# Patient Record
Sex: Female | Born: 1995 | Race: Black or African American | Hispanic: No | Marital: Single | Smoking: Never smoker
Health system: Southern US, Community
[De-identification: ages and names within clinical notes are randomized; demographics above are authoritative.]

---

## 2018-09-28 ENCOUNTER — Emergency Department (HOSPITAL_COMMUNITY)
Admission: EM | Admit: 2018-09-28 | Discharge: 2018-09-28 | Disposition: A | Discharge status: Home / Self Care | Payer: BLUE CROSS/BLUE SHIELD | Attending: Emergency Medicine | Admitting: Emergency Medicine

## 2018-09-28 ENCOUNTER — Encounter (HOSPITAL_COMMUNITY): Payer: Self-pay | Admitting: *Deleted

## 2018-09-28 ENCOUNTER — Other Ambulatory Visit: Payer: Self-pay

## 2018-09-28 ENCOUNTER — Emergency Department (HOSPITAL_COMMUNITY): Payer: BLUE CROSS/BLUE SHIELD

## 2018-09-28 DIAGNOSIS — S161XXA Strain of muscle, fascia and tendon at neck level, initial encounter: Secondary | ICD-10-CM | POA: Diagnosis not present

## 2018-09-28 DIAGNOSIS — Y939 Activity, unspecified: Secondary | ICD-10-CM | POA: Diagnosis not present

## 2018-09-28 DIAGNOSIS — M542 Cervicalgia: Secondary | ICD-10-CM | POA: Diagnosis present

## 2018-09-28 DIAGNOSIS — Y999 Unspecified external cause status: Secondary | ICD-10-CM | POA: Insufficient documentation

## 2018-09-28 DIAGNOSIS — Y929 Unspecified place or not applicable: Secondary | ICD-10-CM | POA: Diagnosis not present

## 2018-09-28 MED ORDER — IBUPROFEN 800 MG PO TABS
800.0000 mg | ORAL_TABLET | Freq: Once | ORAL | Status: AC
  Administered 2018-09-28: 800 mg via ORAL
  Filled 2018-09-28: qty 1

## 2018-09-28 MED ORDER — CYCLOBENZAPRINE HCL 10 MG PO TABS: 10.0000 mg | ORAL_TABLET | Freq: Two times a day (BID) | ORAL | 0 refills | Status: DC | PRN

## 2018-09-28 MED ORDER — ACETAMINOPHEN 325 MG PO TABS
650.0000 mg | ORAL_TABLET | Freq: Once | ORAL | Status: AC
  Administered 2018-09-28: 650 mg via ORAL
  Filled 2018-09-28: qty 2

## 2018-09-28 NOTE — Discharge Instructions (Addendum)
Continue taking Motrin at home every 6-8 hours as needed for pain.  Take prescription for Flexeril as needed for muscle tightness.  Do not drive or operate machinery if taking Flexeril. Apply warm compresses to sore muscles for 20 minutes at a time followed by gentle stretching. Recommend recheck with your primary care provider if not improving, return to ER for new or worsening symptoms.
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
## 2018-09-28 NOTE — ED Notes (Signed)
Patient verbalizes understanding of discharge instructions. Opportunity for questioning and answers were provided. Armband removed by staff, pt discharged from ED ambulatory.   
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
## 2020-02-17 ENCOUNTER — Encounter (HOSPITAL_COMMUNITY): Payer: Self-pay | Admitting: Emergency Medicine

## 2020-02-17 ENCOUNTER — Other Ambulatory Visit: Payer: Self-pay

## 2020-02-17 ENCOUNTER — Emergency Department (HOSPITAL_COMMUNITY)
Admission: EM | Admit: 2020-02-17 | Discharge: 2020-02-18 | Disposition: A | Discharge status: Home / Self Care | Payer: BLUE CROSS/BLUE SHIELD | Attending: Emergency Medicine | Admitting: Emergency Medicine

## 2020-02-17 DIAGNOSIS — R109 Unspecified abdominal pain: Secondary | ICD-10-CM | POA: Insufficient documentation

## 2020-02-17 DIAGNOSIS — R112 Nausea with vomiting, unspecified: Secondary | ICD-10-CM | POA: Diagnosis not present

## 2020-02-17 DIAGNOSIS — R1084 Generalized abdominal pain: Secondary | ICD-10-CM

## 2020-02-17 DIAGNOSIS — R197 Diarrhea, unspecified: Secondary | ICD-10-CM | POA: Diagnosis not present

## 2020-02-17 LAB — URINALYSIS, ROUTINE W REFLEX MICROSCOPIC
Bacteria, UA: NONE SEEN
Bilirubin Urine: NEGATIVE
Glucose, UA: NEGATIVE mg/dL
Ketones, ur: 5 mg/dL — AB
Leukocytes,Ua: NEGATIVE
Nitrite: NEGATIVE
Protein, ur: NEGATIVE mg/dL
Specific Gravity, Urine: 1.023 (ref 1.005–1.030)
pH: 5 (ref 5.0–8.0)

## 2020-02-17 LAB — CBC WITH DIFFERENTIAL/PLATELET
Abs Immature Granulocytes: 0.03 10*3/uL (ref 0.00–0.07)
Basophils Absolute: 0 10*3/uL (ref 0.0–0.1)
Basophils Relative: 0 %
Eosinophils Absolute: 0 10*3/uL (ref 0.0–0.5)
Eosinophils Relative: 0 %
HCT: 31.4 % — ABNORMAL LOW (ref 36.0–46.0)
Hemoglobin: 9.7 g/dL — ABNORMAL LOW (ref 12.0–15.0)
Immature Granulocytes: 0 %
Lymphocytes Relative: 19 %
Lymphs Abs: 1.8 10*3/uL (ref 0.7–4.0)
MCH: 23.1 pg — ABNORMAL LOW (ref 26.0–34.0)
MCHC: 30.9 g/dL (ref 30.0–36.0)
MCV: 74.8 fL — ABNORMAL LOW (ref 80.0–100.0)
Monocytes Absolute: 0.7 10*3/uL (ref 0.1–1.0)
Monocytes Relative: 7 %
Neutro Abs: 6.8 10*3/uL (ref 1.7–7.7)
Neutrophils Relative %: 74 %
Platelets: 365 10*3/uL (ref 150–400)
RBC: 4.2 MIL/uL (ref 3.87–5.11)
RDW: 16.6 % — ABNORMAL HIGH (ref 11.5–15.5)
WBC: 9.3 10*3/uL (ref 4.0–10.5)
nRBC: 0 % (ref 0.0–0.2)

## 2020-02-17 LAB — COMPREHENSIVE METABOLIC PANEL
ALT: 8 U/L (ref 0–44)
AST: 13 U/L — ABNORMAL LOW (ref 15–41)
Albumin: 3.7 g/dL (ref 3.5–5.0)
Alkaline Phosphatase: 73 U/L (ref 38–126)
Anion gap: 10 (ref 5–15)
BUN: 9 mg/dL (ref 6–20)
CO2: 24 mmol/L (ref 22–32)
Calcium: 9 mg/dL (ref 8.9–10.3)
Chloride: 102 mmol/L (ref 98–111)
Creatinine, Ser: 0.69 mg/dL (ref 0.44–1.00)
GFR, Estimated: >60 mL/min (ref >60)
Glucose, Bld: 99 mg/dL (ref 70–99)
Potassium: 3.7 mmol/L (ref 3.5–5.1)
Sodium: 136 mmol/L (ref 135–145)
Total Bilirubin: 0.3 mg/dL (ref 0.3–1.2)
Total Protein: 7.1 g/dL (ref 6.5–8.1)

## 2020-02-17 LAB — I-STAT BETA HCG BLOOD, ED (MC, WL, AP ONLY): I-stat hCG, quantitative: <5 m[IU]/mL (ref <5)

## 2020-02-17 LAB — LIPASE, BLOOD: Lipase: 18 U/L (ref 11–51)

## 2020-02-17 NOTE — ED Triage Notes (Signed)
Pt here from home with c/o abd pain along with n/v/d for 5 days ,pt had covid last month
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
## 2020-02-18 MED ORDER — METOCLOPRAMIDE HCL 10 MG PO TABS: 10.0000 mg | ORAL_TABLET | Freq: Four times a day (QID) | ORAL | 0 refills | Status: AC

## 2020-02-18 NOTE — ED Provider Notes (Signed)
[facility] EMERGENCY DEPARTMENT Provider Note   CSN: [id number] Arrival date & time: 02/17/20  1302     History No chief complaint on file.   Maureen Little is a 25 y.o. female.  Patient to ED with complaint of abdominal pain that comes and goes over the last 5 days. She has had nausea and vomiting, reporting 3 episodes over the 5 day period, and -"1 or 2" episodes of diarrhea. No fever. No urinary symptoms. She states she has chronic BV symptoms currently under the care of GYN and a separate complaint from current abdominal pain. No cough, congestion or sore throat. She has taken ibuprofen without relief of pain.  The history is provided by the patient. No language interpreter was used.       History reviewed. No pertinent past medical history.  There are no problems to display for this patient.   History reviewed. No pertinent surgical history.   OB History   No obstetric history on file.     History reviewed. No pertinent family history.  Social History   Tobacco Use  . Smoking status: Never Smoker  . Smokeless tobacco: Never Used  Substance Use Topics  . Alcohol use: Yes  . Drug use: Never    Home Medications Prior to Admission medications   Medication Sig Start Date End Date Taking? Authorizing Provider  etonogestrel (NEXPLANON) 68 MG IMPL implant 68 mg by Subdermal route one time only at 6 PM.   Yes [provider]  ondansetron (ZOFRAN) 4 MG tablet Take 4 mg by mouth every 8 (eight) hours as needed for nausea or vomiting.   Yes [provider]    Allergies    Patient has no known allergies.  Review of Systems   Review of Systems  Constitutional: Negative for chills and fever.  HENT: Negative.   Respiratory: Negative.   Cardiovascular: Negative.   Gastrointestinal: Positive for abdominal pain, diarrhea and vomiting.  Genitourinary:       See HPI.  Musculoskeletal: Negative.  Negative for back pain.  Skin:  Negative.   Neurological: Negative.  Negative for weakness.    Physical Exam Updated Vital Signs BP 133/78 (BP Location: Right Arm)   Pulse 100   Temp 99.3 F (37.4 C) (Oral)   Resp 18   Wt 94.8 kg   SpO2 100%   BMI 33.73 kg/m   Physical Exam Vitals and nursing note reviewed.  Constitutional:      Appearance: She is well-developed and well-nourished.  HENT:     Head: Normocephalic.  Cardiovascular:     Rate and Rhythm: Normal rate and regular rhythm.     Heart sounds: No murmur heard.   Pulmonary:     Effort: Pulmonary effort is normal.     Breath sounds: Normal breath sounds. No wheezing, rhonchi or rales.  Abdominal:     General: Bowel sounds are normal.     Palpations: Abdomen is soft.     Tenderness: There is no abdominal tenderness. There is no guarding or rebound.  Musculoskeletal:        General: Normal range of motion.     Cervical back: Normal range of motion and neck supple.  Skin:    General: Skin is warm and dry.     Findings: No rash.  Neurological:     Mental Status: She is alert and oriented to person, place, and time.  Psychiatric:        Mood and Affect: Mood and  affect normal.     ED Results / Procedures / Treatments   Labs (all labs ordered are listed, but only abnormal results are displayed) Labs Reviewed  COMPREHENSIVE METABOLIC PANEL - Abnormal; Notable for the following components:      Result Value   AST 13 (*)    All other components within normal limits  CBC WITH DIFFERENTIAL/PLATELET - Abnormal; Notable for the following components:   Hemoglobin 9.7 (*)    HCT 31.4 (*)    MCV 74.8 (*)    MCH 23.1 (*)    RDW 16.6 (*)    All other components within normal limits  URINALYSIS, ROUTINE W REFLEX MICROSCOPIC - Abnormal; Notable for the following components:   Hgb urine dipstick MODERATE (*)    Ketones, ur 5 (*)    All other components within normal limits  LIPASE, BLOOD  I-STAT BETA HCG BLOOD, ED ([facility], [facility], [facility] ONLY)     EKG None  Radiology No results found.  Procedures Procedures (including critical care time)  Medications Ordered in ED Medications - No data to display  ED Course  I have reviewed the triage vital signs and the nursing notes.  Pertinent labs & imaging results that were available during my care of the patient were reviewed by me and considered in my medical decision making (see chart for details).    MDM Rules/Calculators/A&P                          Patient to ED with c/o abdominal pain x 5 days, infrequent vomiting, even less diarrhea. No fever. No urinary symptoms.   The patient is very well appearing and in no acute distress. She comes to the bed with a bag of Dione Plover, some of which she ate earlier while waiting to be seen, and a soda. No vomiting observed in the ED. VSS. Abdominal exam benign. Labs reviewed. No leukocytosis to indicate infection or inflammation. She appears quite comfortable.   No imaging is indicated. Will provide Reglan for symptomatic relief. Return precautions discussed. 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   Final Clinical Impression(s) / ED Diagnoses Final diagnoses:  None   1. Nonspecific abdominal pain   Rx / DC Orders ED Discharge Orders    None       Elpidio Anis, PA-C 02/18/20 4008    Geoffery Lyons, MD 02/18/20 [phone]

## 2020-02-18 NOTE — ED Notes (Signed)
Pt in a hurry to leave, refused recheck of vital signs. Did not want to wait for RN to provide discharge paperwork   Pt A&Ox4 and independently out of department Does not appear in distress, respirations are even and non-labored  Skin is warm, dry and intact

## 2020-02-18 NOTE — Discharge Instructions (Signed)
Your labs and your exam do not suggest any acute or serious illness or condition. You can be discharged home and should see your doctor who can do other tests of a non-urgent nature to determine the cause of your abdominal pain if it persists.   Take Reglan for symptoms of pain and nausea if needed, every 6-8 hours. Return to the emergency department if you have any high fever, uncontrolled/persistent or bloody vomiting, severe pain or new concern.
